# Patient Record
Sex: Female | Born: 1995 | Race: White | Hispanic: No | Marital: Single | Smoking: Current some day smoker
Health system: Southern US, Community
[De-identification: ages and names within clinical notes are randomized; demographics above are authoritative.]

---

## 2017-08-28 ENCOUNTER — Encounter: Payer: Self-pay | Admitting: Emergency Medicine

## 2017-08-28 ENCOUNTER — Other Ambulatory Visit: Payer: Self-pay

## 2017-08-28 ENCOUNTER — Ambulatory Visit (INDEPENDENT_AMBULATORY_CARE_PROVIDER_SITE_OTHER): Payer: Worker's Compensation | Admitting: Emergency Medicine

## 2017-08-28 ENCOUNTER — Ambulatory Visit (INDEPENDENT_AMBULATORY_CARE_PROVIDER_SITE_OTHER): Payer: Worker's Compensation

## 2017-08-28 VITALS — BP 121/84 | HR 85 | Temp 97.9°F | Resp 16 | Ht 61.0 in | Wt 122.2 lb

## 2017-08-28 DIAGNOSIS — M79674 Pain in right toe(s): Secondary | ICD-10-CM | POA: Diagnosis not present

## 2017-08-28 DIAGNOSIS — W1809XA Striking against other object with subsequent fall, initial encounter: Secondary | ICD-10-CM | POA: Diagnosis not present

## 2017-08-28 DIAGNOSIS — S92424A Nondisplaced fracture of distal phalanx of right great toe, initial encounter for closed fracture: Secondary | ICD-10-CM | POA: Diagnosis not present

## 2017-08-28 DIAGNOSIS — S99921A Unspecified injury of right foot, initial encounter: Secondary | ICD-10-CM

## 2017-08-28 MED ORDER — HYDROCODONE-ACETAMINOPHEN 5-325 MG PO TABS: 1.0000 | ORAL_TABLET | Freq: Four times a day (QID) | ORAL | 0 refills | Status: AC | PRN

## 2017-08-28 NOTE — Progress Notes (Signed)
Marie Arellano 21 y.o.   Chief Complaint  Patient presents with  . right great toe injury    injury occurred on 08/27/17.  Per pt she was pulling out bar mats at the end of the night.  She was walking and kiced the bar mat with her right and fell.   Per pt she felt like she stubbed her toe really bad and culdn't walk on her foot last because her toe was throbbing and felt like the toe was on fire.  Woke and still unable to walk on, no change, still throbbing, hot and swollen.  Pt using ice on toe and ibuprofen 400 mg at 0330    HISTORY OF PRESENT ILLNESS: This is a 22 y.o. female complaining of right big toe pain.  Sustained injury last night around 10 PM.  HPI    Prior to Admission medications   Not on File    No Known Allergies  There are no active problems to display for this patient.   No past medical history on file.    Social History   Socioeconomic History  . Marital status: Single    Spouse name: Not on file  . Number of children: Not on file  . Years of education: Not on file  . Highest education level: Not on file  Occupational History  . Not on file  Social Needs  . Financial resource strain: Not on file  . Food insecurity:    Worry: Not on file    Inability: Not on file  . Transportation needs:    Medical: Not on file    Non-medical: Not on file  Tobacco Use  . Smoking status: Current Some Day Smoker    Types: E-cigarettes  . Smokeless tobacco: Never Used  Substance and Sexual Activity  . Alcohol use: Yes    Alcohol/week: 1.2 oz    Types: 2 Cans of beer per week  . Drug use: Never  . Sexual activity: Not on file  Lifestyle  . Physical activity:    Days per week: Not on file    Minutes per session: Not on file  . Stress: Not on file  Relationships  . Social connections:    Talks on phone: Not on file    Gets together: Not on file    Attends religious service: Not on file    Active member of club or organization: Not on file    Attends  meetings of clubs or organizations: Not on file    Relationship status: Not on file  . Intimate partner violence:    Fear of current or ex partner: Not on file    Emotionally abused: Not on file    Physically abused: Not on file    Forced sexual activity: Not on file  Other Topics Concern  . Not on file  Social History Narrative  . Not on file    No family history on file.   Review of Systems  Constitutional: Negative.  Negative for chills and fever.  HENT: Negative for sore throat.   Respiratory: Negative for shortness of breath.   Cardiovascular: Negative for chest pain and palpitations.  Gastrointestinal: Negative for nausea and vomiting.  Musculoskeletal: Positive for joint pain (Right big toe).  Neurological: Negative for dizziness and headaches.  Endo/Heme/Allergies: Negative.   All other systems reviewed and are negative.   Vitals:   08/28/17 1105  BP: 121/84  Pulse: 85  Resp: 16  Temp: 97.9 F (36.6 C)  SpO2:  100%    Physical Exam  Constitutional: She is oriented to person, place, and time. She appears well-developed and well-nourished.  HENT:  Head: Normocephalic and atraumatic.  Eyes: Pupils are equal, round, and reactive to light. EOM are normal.  Neck: Normal range of motion.  Pulmonary/Chest: Effort normal.  Musculoskeletal:  Right big toe: Nail intact.  Positive swelling and bruising.  Tender to move on palpation.  NVI.  Neurological: She is alert and oriented to person, place, and time.  Skin: Capillary refill takes less than 2 seconds.  Psychiatric: She has a normal mood and affect. Her behavior is normal.  Vitals reviewed.  Dg Toe Great Right  Result Date: 08/28/2017 CLINICAL DATA:  Stumped right great toe. EXAM: RIGHT GREAT TOE COMPARISON:  None. FINDINGS: There is an acute intra-articular fracture involving the medial base of the first distal phalanx. The fracture fragments are nondisplaced. Diffuse soft tissue swelling is noted. IMPRESSION: 1.  Acute, nondisplaced intra-articular fracture involves the lateral base of the first distal phalanx. Electronically Signed   By: Signa Kell M.D.   On: 08/28/2017 11:53   A total of 30 minutes was spent in the room with the patient, greater than 50% of which was in counseling/coordination of care regarding diagnosis, treatment of fractures, pain control medications, prognosis, and need for follow-up as needed.  X-ray reviewed with patient in the room.   ASSESSMENT & PLAN: Marie Arellano was seen today for right great toe injury.  Diagnoses and all orders for this visit:  Toe injury, right, initial encounter -     DG Toe Great Right; Future -     Post op shoe  Closed nondisplaced fracture of distal phalanx of right great toe, initial encounter -     HYDROcodone-acetaminophen (NORCO) 5-325 MG tablet; Take 1 tablet by mouth every 6 (six) hours as needed for moderate pain. -     Post op shoe  Pain of right great toe -     HYDROcodone-acetaminophen (NORCO) 5-325 MG tablet; Take 1 tablet by mouth every 6 (six) hours as needed for moderate pain.    Patient Instructions       IF you received an x-ray today, you will receive an invoice from [facility]. Please contact [facility] at [phone] with questions or concerns regarding your invoice.   IF you received labwork today, you will receive an invoice from [facility]. Please contact [facility] at [phone] with questions or concerns regarding your invoice.   Our billing staff will not be able to assist you with questions regarding bills from these companies.  You will be contacted with the lab results as soon as they are available. The fastest way to get your results is to activate your My Chart account. Instructions are located on the last page of this paperwork. If you have not heard from [place] regarding the results in 2 weeks, please contact this office.     Toe Fracture A toe fracture is a break in one of the toe  bones (phalanges). Follow these instructions at home: If you have a cast:  Do not stick anything inside the cast to scratch your skin.  Check the skin around the cast every day. Tell your doctor about any concerns. Do not put lotion on the skin underneath the cast. You may put lotion on dry skin around the edges of the cast.  Do not put pressure on any part of the cast until it is fully hardened. This may take many hours.  Keep  the cast clean and dry. Bathing  Do not take baths, swim, or use a hot tub until your doctor says that you can. Ask your doctor if you can take showers. You may only be allowed to take sponge baths for bathing.  If your doctor says that bathing and showering are okay, cover the cast or bandage (dressing) with a watertight plastic bag to protect it from water. Do not let the cast or bandage get wet. Managing pain, stiffness, and swelling  If you do not have a cast, put ice on the injured area if told by your doctor: ? Put ice in a plastic bag. ? Place a towel between your skin and the bag. ? Leave the ice on for 20 minutes, 2-3 times per day.  Move your toes often to avoid stiffness and to lessen swelling.  Raise (elevate) the injured area above the level of your heart while you are sitting or lying down. Driving  Do not drive or use heavy machinery while taking pain medicine.  Do not drive while wearing a cast on a foot that you use for driving. Activity  Return to your normal activities as told by your doctor. Ask your doctor what activities are safe for you.  Perform exercises daily as told by your doctor or therapist. Safety  Do not use your leg to support your body weight until your doctor says that you can. Use crutches or other tools to help you move around as told by your doctor. General instructions  If your toe was taped to a toe that is next to it (buddy taping), follow your doctor's instructions for changing the gauze and tape. Change it  more often: ? If the gauze and tape get wet. If this happens, dry the space between the toes. ? If the gauze and tape are too tight and they cause your toe to become pale or to lose feeling (numb).  Wear a protective shoe as told by your doctor. If you were not given one, wear sturdy shoes that support your foot. Your shoes should not pinch your toes. Your shoes should not fit tightly against your toes.  Do not use any tobacco products, including cigarettes, chewing tobacco, or e-cigarettes. Tobacco can delay bone healing. If you need help quitting, ask your doctor.  Take medicines only as told by your doctor.  Keep all follow-up visits as told by your doctor. This is important. Contact a doctor if:  You have a fever.  Your pain medicine is not helping.  Your toe feels cold.  You lose feeling (have numbness) in your toe.  You still have pain after one week of rest and treatment.  You still have pain after your doctor has said that you can start walking again.  You have pain or tingling in your foot, and it is not going away.  You have loss of feeling in your foot, and it is not going away. Get help right away if:  You have severe pain.  You have redness or swelling (inflammation) in your toe, and it is getting worse.  You have pain or loss of feeling in your toe, and it is getting worse.  Your toe is blue. This information is not intended to replace advice given to you by your health care provider. Make sure you discuss any questions you have with your health care provider. Document Released: 07/22/2007 Document Revised: 10/07/2015 Document Reviewed: 11/29/2013 Elsevier Interactive Patient Education  [employer]2018 [employer].  Agustina Caroli, MD [facility]

## 2017-08-28 NOTE — Patient Instructions (Addendum)
   IF you received an x-ray today, you will receive an invoice from [facility]. Please contact [facility] at [phone] with questions or concerns regarding your invoice.   IF you received labwork today, you will receive an invoice from [facility]. Please contact [facility] at [phone] with questions or concerns regarding your invoice.   Our billing staff will not be able to assist you with questions regarding bills from these companies.  You will be contacted with the lab results as soon as they are available. The fastest way to get your results is to activate your My Chart account. Instructions are located on the last page of this paperwork. If you have not heard from us regarding the results in 2 weeks, please contact this office.     Toe Fracture A toe fracture is a break in one of the toe bones (phalanges). Follow these instructions at home: If you have a cast:  Do not stick anything inside the cast to scratch your skin.  Check the skin around the cast every day. Tell your doctor about any concerns. Do not put lotion on the skin underneath the cast. You may put lotion on dry skin around the edges of the cast.  Do not put pressure on any part of the cast until it is fully hardened. This may take many hours.  Keep the cast clean and dry. Bathing  Do not take baths, swim, or use a hot tub until your doctor says that you can. Ask your doctor if you can take showers. You may only be allowed to take sponge baths for bathing.  If your doctor says that bathing and showering are okay, cover the cast or bandage (dressing) with a watertight plastic bag to protect it from water. Do not let the cast or bandage get wet. Managing pain, stiffness, and swelling  If you do not have a cast, put ice on the injured area if told by your doctor: ? Put ice in a plastic bag. ? Place a towel between your skin and the bag. ? Leave the ice on for 20 minutes, 2-3 times per  day.  Move your toes often to avoid stiffness and to lessen swelling.  Raise (elevate) the injured area above the level of your heart while you are sitting or lying down. Driving  Do not drive or use heavy machinery while taking pain medicine.  Do not drive while wearing a cast on a foot that you use for driving. Activity  Return to your normal activities as told by your doctor. Ask your doctor what activities are safe for you.  Perform exercises daily as told by your doctor or therapist. Safety  Do not use your leg to support your body weight until your doctor says that you can. Use crutches or other tools to help you move around as told by your doctor. General instructions  If your toe was taped to a toe that is next to it (buddy taping), follow your doctor's instructions for changing the gauze and tape. Change it more often: ? If the gauze and tape get wet. If this happens, dry the space between the toes. ? If the gauze and tape are too tight and they cause your toe to become pale or to lose feeling (numb).  Wear a protective shoe as told by your doctor. If you were not given one, wear sturdy shoes that support your foot. Your shoes should not pinch your toes. Your shoes should not fit tightly against   your toes.  Do not use any tobacco products, including cigarettes, chewing tobacco, or e-cigarettes. Tobacco can delay bone healing. If you need help quitting, ask your doctor.  Take medicines only as told by your doctor.  Keep all follow-up visits as told by your doctor. This is important. Contact a doctor if:  You have a fever.  Your pain medicine is not helping.  Your toe feels cold.  You lose feeling (have numbness) in your toe.  You still have pain after one week of rest and treatment.  You still have pain after your doctor has said that you can start walking again.  You have pain or tingling in your foot, and it is not going away.  You have loss of feeling in your  foot, and it is not going away. Get help right away if:  You have severe pain.  You have redness or swelling (inflammation) in your toe, and it is getting worse.  You have pain or loss of feeling in your toe, and it is getting worse.  Your toe is blue. This information is not intended to replace advice given to you by your health care provider. Make sure you discuss any questions you have with your health care provider. Document Released: 07/22/2007 Document Revised: 10/07/2015 Document Reviewed: 11/29/2013 Elsevier Interactive Patient Education  2018 [employer] Inc[employer]  

## 2019-05-03 IMAGING — DX DG TOE GREAT 2+V*R*
3 series · 4 of 4 positions shown · non-contrast
Comparison: None.

CLINICAL DATA: Stumped right great toe.

EXAM:
RIGHT GREAT TOE

[Series 1: toe ap · 0.14mm/px · 2 of 2 slices shown]
[im 1/2]
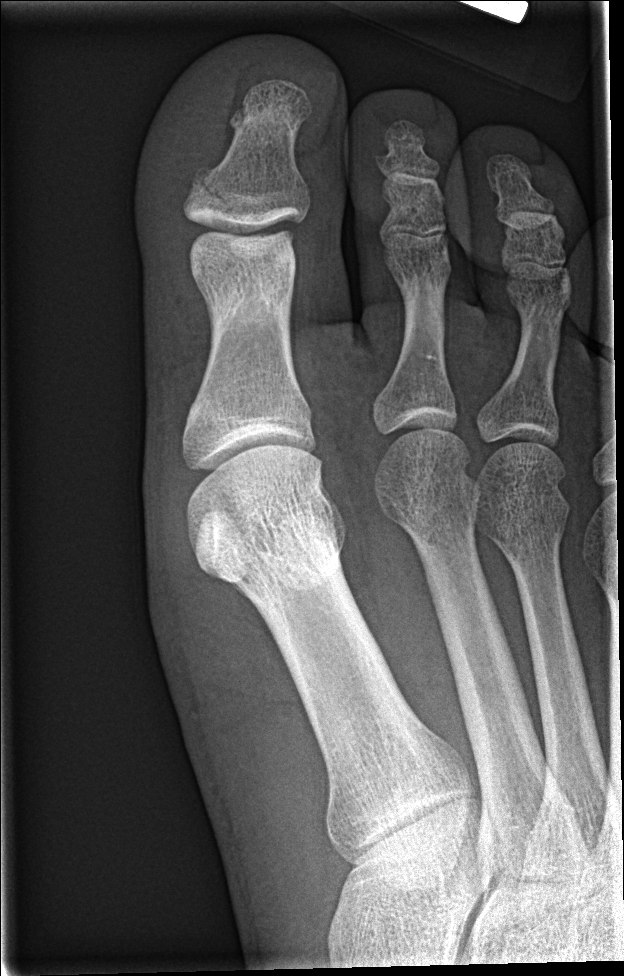
[im 2/2]
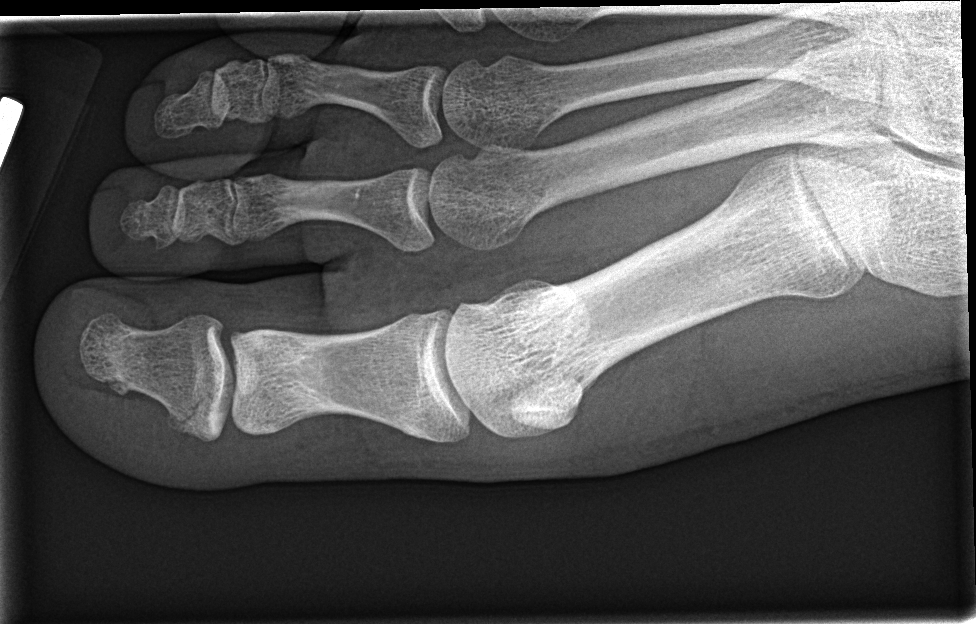

[toe obl]
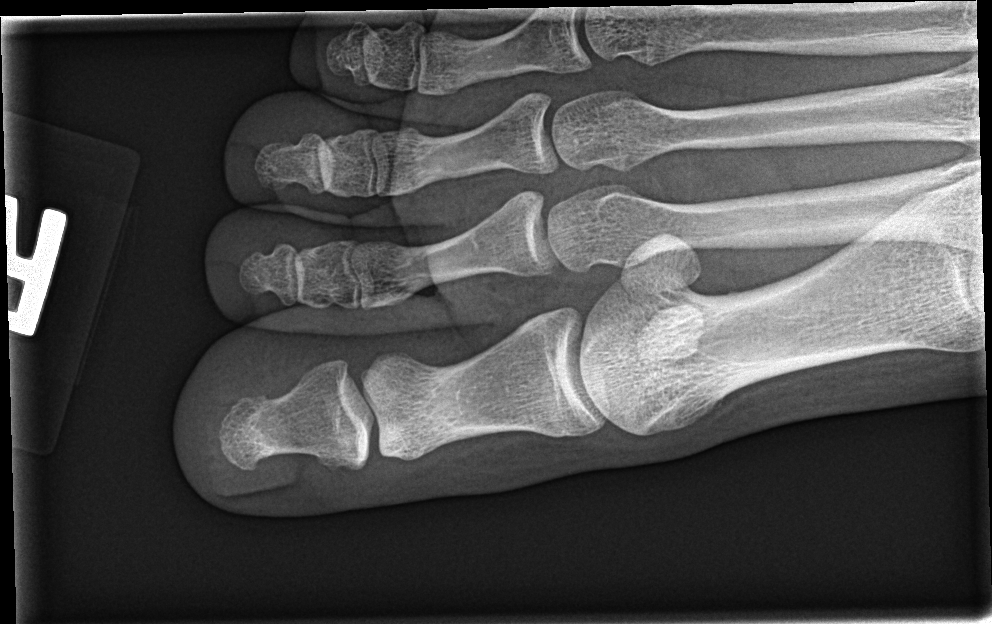

[toe lat]
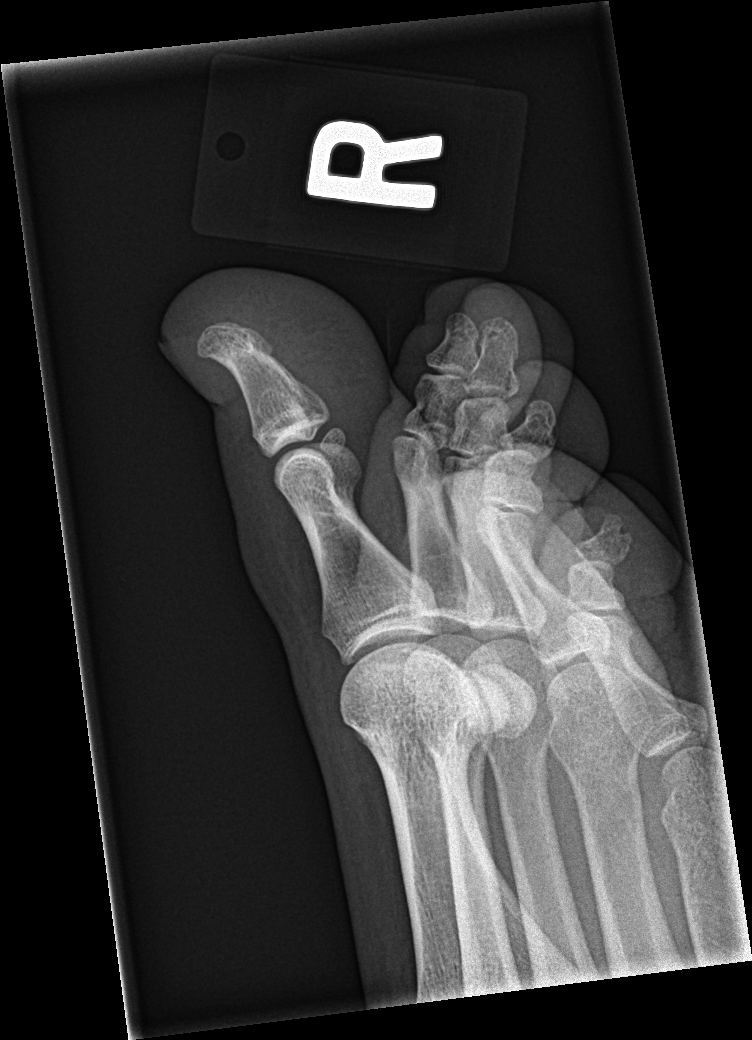

[4 of 4 positions shown; findings below may reference images not displayed]

FINDINGS: There is an acute intra-articular fracture involving the medial base
of the first distal phalanx. The fracture fragments are
nondisplaced. Diffuse soft tissue swelling is noted.
IMPRESSION: 1. Acute, nondisplaced intra-articular fracture involves the lateral
base of the first distal phalanx.
